# Patient Record
Sex: Female | Born: 1965 | Race: White | Hispanic: No | Marital: Married | Smoking: Never smoker
Health system: Southern US, Community
[De-identification: ages and names within clinical notes are randomized; demographics above are authoritative.]

## PROBLEM LIST (undated history)

## (undated) DIAGNOSIS — M8580 Other specified disorders of bone density and structure, unspecified site: Secondary | ICD-10-CM

## (undated) DIAGNOSIS — J302 Other seasonal allergic rhinitis: Secondary | ICD-10-CM

## (undated) HISTORY — DX: Other seasonal allergic rhinitis: J30.2

## (undated) HISTORY — DX: Other specified disorders of bone density and structure, unspecified site: M85.80

## (undated) HISTORY — PX: CHOLECYSTECTOMY: SHX55

---

## 1998-01-03 ENCOUNTER — Other Ambulatory Visit: Admission: RE | Admit: 1998-01-03 | Discharge: 1998-01-03 | Payer: Self-pay | Admitting: Obstetrics & Gynecology

## 1999-01-06 ENCOUNTER — Other Ambulatory Visit: Admission: RE | Admit: 1999-01-06 | Discharge: 1999-01-06 | Payer: Self-pay | Admitting: Obstetrics & Gynecology

## 1999-12-05 ENCOUNTER — Ambulatory Visit: Admission: RE | Admit: 1999-12-05 | Discharge: 1999-12-05 | Payer: Self-pay | Admitting: Sports Medicine

## 2000-02-01 ENCOUNTER — Other Ambulatory Visit: Admission: RE | Admit: 2000-02-01 | Discharge: 2000-02-01 | Payer: Self-pay | Admitting: Obstetrics & Gynecology

## 2000-06-05 ENCOUNTER — Inpatient Hospital Stay (HOSPITAL_COMMUNITY): Admission: RE | Admit: 2000-06-05 | Discharge: 2000-06-07 | Payer: Self-pay | Admitting: Family Medicine

## 2000-06-05 ENCOUNTER — Encounter: Payer: Self-pay | Admitting: Gastroenterology

## 2000-06-06 ENCOUNTER — Encounter: Payer: Self-pay | Admitting: General Surgery

## 2001-03-21 ENCOUNTER — Other Ambulatory Visit: Admission: RE | Admit: 2001-03-21 | Discharge: 2001-03-21 | Payer: Self-pay | Admitting: Obstetrics & Gynecology

## 2002-04-23 ENCOUNTER — Other Ambulatory Visit: Admission: RE | Admit: 2002-04-23 | Discharge: 2002-04-23 | Payer: Self-pay | Admitting: Obstetrics & Gynecology

## 2003-07-09 ENCOUNTER — Other Ambulatory Visit: Admission: RE | Admit: 2003-07-09 | Discharge: 2003-07-09 | Payer: Self-pay | Admitting: Obstetrics & Gynecology

## 2003-07-16 ENCOUNTER — Ambulatory Visit (HOSPITAL_BASED_OUTPATIENT_CLINIC_OR_DEPARTMENT_OTHER): Admission: RE | Admit: 2003-07-16 | Discharge: 2003-07-16 | Payer: Self-pay | Admitting: Plastic Surgery

## 2003-07-16 ENCOUNTER — Ambulatory Visit (HOSPITAL_COMMUNITY): Admission: RE | Admit: 2003-07-16 | Discharge: 2003-07-16 | Payer: Self-pay | Admitting: Plastic Surgery

## 2004-08-06 ENCOUNTER — Other Ambulatory Visit: Admission: RE | Admit: 2004-08-06 | Discharge: 2004-08-06 | Payer: Self-pay | Admitting: Obstetrics & Gynecology

## 2005-08-30 ENCOUNTER — Other Ambulatory Visit: Admission: RE | Admit: 2005-08-30 | Discharge: 2005-08-30 | Payer: Self-pay | Admitting: Obstetrics & Gynecology

## 2014-04-04 ENCOUNTER — Other Ambulatory Visit: Payer: Self-pay | Admitting: Obstetrics & Gynecology

## 2014-04-05 LAB — CYTOLOGY - PAP

## 2015-04-15 ENCOUNTER — Other Ambulatory Visit: Payer: Self-pay | Admitting: Obstetrics & Gynecology

## 2015-04-15 DIAGNOSIS — R928 Other abnormal and inconclusive findings on diagnostic imaging of breast: Secondary | ICD-10-CM

## 2015-04-23 ENCOUNTER — Ambulatory Visit
Admission: RE | Admit: 2015-04-23 | Discharge: 2015-04-23 | Disposition: A | Discharge status: Home / Self Care | Payer: Managed Care, Other (non HMO) | Source: Ambulatory Visit | Attending: Obstetrics & Gynecology | Admitting: Obstetrics & Gynecology

## 2015-04-23 DIAGNOSIS — R928 Other abnormal and inconclusive findings on diagnostic imaging of breast: Secondary | ICD-10-CM

## 2015-09-15 ENCOUNTER — Other Ambulatory Visit: Payer: Self-pay | Admitting: Obstetrics & Gynecology

## 2015-09-15 DIAGNOSIS — N63 Unspecified lump in unspecified breast: Secondary | ICD-10-CM

## 2015-09-25 ENCOUNTER — Encounter: Payer: Self-pay | Admitting: Family Medicine

## 2015-10-20 ENCOUNTER — Ambulatory Visit
Admission: RE | Admit: 2015-10-20 | Discharge: 2015-10-20 | Disposition: A | Discharge status: Home / Self Care | Payer: Managed Care, Other (non HMO) | Source: Ambulatory Visit | Attending: Obstetrics & Gynecology | Admitting: Obstetrics & Gynecology

## 2015-10-20 DIAGNOSIS — N63 Unspecified lump in unspecified breast: Secondary | ICD-10-CM

## 2016-04-06 ENCOUNTER — Other Ambulatory Visit: Payer: Self-pay | Admitting: Obstetrics & Gynecology

## 2016-04-06 DIAGNOSIS — N63 Unspecified lump in unspecified breast: Secondary | ICD-10-CM

## 2016-04-29 ENCOUNTER — Ambulatory Visit
Admission: RE | Admit: 2016-04-29 | Discharge: 2016-04-29 | Disposition: A | Discharge status: Home / Self Care | Payer: Managed Care, Other (non HMO) | Source: Ambulatory Visit | Attending: Obstetrics & Gynecology | Admitting: Obstetrics & Gynecology

## 2016-04-29 DIAGNOSIS — N63 Unspecified lump in unspecified breast: Secondary | ICD-10-CM

## 2016-07-21 ENCOUNTER — Encounter: Payer: Self-pay | Admitting: Gastroenterology

## 2016-09-17 ENCOUNTER — Ambulatory Visit (AMBULATORY_SURGERY_CENTER): Payer: Self-pay | Admitting: *Deleted

## 2016-09-17 ENCOUNTER — Encounter: Payer: Self-pay | Admitting: Gastroenterology

## 2016-09-17 VITALS — Ht 69.0 in | Wt 180.2 lb

## 2016-09-17 DIAGNOSIS — Z1211 Encounter for screening for malignant neoplasm of colon: Secondary | ICD-10-CM

## 2016-09-17 MED ORDER — NA SULFATE-K SULFATE-MG SULF 17.5-3.13-1.6 GM/177ML PO SOLN: 1.0000 | Freq: Once | ORAL | 0 refills | Status: AC

## 2016-09-17 NOTE — Progress Notes (Signed)
Denies allergies to eggs or soy products. Denies complications with sedation or anesthesia. Denies O2 use. Denies use of diet or weight loss medications.  Emmi instructions given for colonoscopy.  

## 2016-10-01 ENCOUNTER — Ambulatory Visit (AMBULATORY_SURGERY_CENTER): Payer: Managed Care, Other (non HMO) | Admitting: Gastroenterology

## 2016-10-01 ENCOUNTER — Encounter: Payer: Self-pay | Admitting: Gastroenterology

## 2016-10-01 VITALS — BP 127/75 | HR 63 | Temp 99.5°F | Resp 14 | Ht 69.0 in | Wt 180.0 lb

## 2016-10-01 DIAGNOSIS — Z1211 Encounter for screening for malignant neoplasm of colon: Secondary | ICD-10-CM | POA: Diagnosis not present

## 2016-10-01 DIAGNOSIS — Z1212 Encounter for screening for malignant neoplasm of rectum: Secondary | ICD-10-CM | POA: Diagnosis not present

## 2016-10-01 MED ORDER — SODIUM CHLORIDE 0.9 % IV SOLN: 500.0000 mL | INTRAVENOUS | Status: AC

## 2016-10-01 NOTE — Progress Notes (Signed)
  [facility] Anesthesia Post-op Note  Patient: Sheila Ruiz  Procedure(s) Performed: colonoscopy  Patient Location: [facility] - Recovery Area  Anesthesia Type: Deep Sedation/Propofol  Level of Consciousness: awake, oriented and patient cooperative  Airway and Oxygen Therapy: Patient Spontanous Breathing  Post-op Pain: none  Post-op Assessment:  Post-op Vital signs reviewed, Patient's Cardiovascular Status Stable, Respiratory Function Stable, Patent Airway, No signs of Nausea or vomiting and Pain level controlled  Post-op Vital Signs: Reviewed and stable  Complications: No apparent anesthesia complications  Sheila Ruiz 11:27 AM

## 2016-10-01 NOTE — Progress Notes (Signed)
Pt's states no medical or surgical changes since previsit or office visit. 

## 2016-10-01 NOTE — Patient Instructions (Signed)
YOU HAD AN ENDOSCOPIC PROCEDURE TODAY AT THE [facility]:   Refer to the procedure report that was given to you for any specific questions about what was found during the examination.  If the procedure report does not answer your questions, please call your gastroenterologist to clarify.  If you requested that your care partner not be given the details of your procedure findings, then the procedure report has been included in a sealed envelope for you to review at your convenience later.  YOU SHOULD EXPECT: Some feelings of bloating in the abdomen. Passage of more gas than usual.  Walking can help get rid of the air that was put into your GI tract during the procedure and reduce the bloating. If you had a lower endoscopy (such as a colonoscopy or flexible sigmoidoscopy) you may notice spotting of blood in your stool or on the toilet paper. If you underwent a bowel prep for your procedure, you may not have a normal bowel movement for a few days.  Please Note:  You might notice some irritation and congestion in your nose or some drainage.  This is from the oxygen used during your procedure.  There is no need for concern and it should clear up in a day or so.  SYMPTOMS TO REPORT IMMEDIATELY:   Following lower endoscopy (colonoscopy or flexible sigmoidoscopy):  Excessive amounts of blood in the stool  Significant tenderness or worsening of abdominal pains  Swelling of the abdomen that is new, acute  Fever of 100F or higher   For urgent or emergent issues, a gastroenterologist can be reached at any hour by calling [phone].   DIET:  We do recommend a small meal at first, but then you may proceed to your regular diet.  Drink plenty of fluids but you should avoid alcoholic beverages for 24 hours.  ACTIVITY:  You should plan to take it easy for the rest of today and you should NOT DRIVE or use heavy machinery until tomorrow (because of the sedation medicines used during the test).     FOLLOW UP: Our staff will call the number listed on your records the next business day following your procedure to check on you and address any questions or concerns that you may have regarding the information given to you following your procedure. If we do not reach you, we will leave a message.  However, if you are feeling well and you are not experiencing any problems, there is no need to return our call.  We will assume that you have returned to your regular daily activities without incident.  If any biopsies were taken you will be contacted by phone or by letter within the next 1-3 weeks.  Please call us at [phone] if you have not heard about the biopsies in 3 weeks.    SIGNATURES/CONFIDENTIALITY: You and/or your care partner have signed paperwork which will be entered into your electronic medical record.  These signatures attest to the fact that that the information above on your After Visit Summary has been reviewed and is understood.  Full responsibility of the confidentiality of this discharge information lies with you and/or your care-partner.  Thank you for letting us take care of your healthcare needs today. 

## 2016-10-01 NOTE — Op Note (Signed)
[facility] Patient Name: Sheila BergerMarianne Ruiz Procedure Date: 10/01/2016 11:03 AM MRN: [id number] Endoscopist: Sherilyn CooterHenry L. Myrtie Neitheranis , MD Age: [age] Referring MD:  Date of Birth: 1966-01-25 Gender: Female Account #: [id number] Procedure:                Colonoscopy Indications:              Screening for colorectal malignant neoplasm, This                            is the patient's first colonoscopy Medicines:                Monitored Anesthesia Care Procedure:                Pre-Anesthesia Assessment:                           - Prior to the procedure, a History and Physical                            was performed, and patient medications and                            allergies were reviewed. The patient's tolerance of                            previous anesthesia was also reviewed. The risks                            and benefits of the procedure and the sedation                            options and risks were discussed with the patient.                            All questions were answered, and informed consent                            was obtained. Prior Anticoagulants: The patient has                            taken no previous anticoagulant or antiplatelet                            agents. ASA Grade Assessment: I - A normal, healthy                            patient. After reviewing the risks and benefits,                            the patient was deemed in satisfactory condition to                            undergo the procedure.  After obtaining informed consent, the colonoscope                            was passed under direct vision. Throughout the                            procedure, the patient's blood pressure, pulse, and                            oxygen saturations were monitored continuously. The                            Model PCF-H190DL [id number]) scope was introduced                            through the anus and advanced  to the the cecum,                            identified by appendiceal orifice and ileocecal                            valve. The colonoscopy was performed without                            difficulty. The patient tolerated the procedure                            well. The quality of the bowel preparation was                            excellent. The ileocecal valve, appendiceal                            orifice, and rectum were photographed. The quality                            of the bowel preparation was evaluated using the                            BBPS [facility] Bowel Preparation Scale) with scores                            of: Right Colon = 3, Transverse Colon = 3 and Left                            Colon = 3 (entire mucosa seen well with no residual                            staining, small fragments of stool or opaque                            liquid). The total BBPS score equals 9. The bowel  preparation used was SUPREP. Scope In: 11:06:52 AM Scope Out: 11:20:15 AM Scope Withdrawal Time: 0 hours 9 minutes 15 seconds  Total Procedure Duration: 0 hours 13 minutes 23 seconds  Findings:                 The perianal and digital rectal examinations were                            normal.                           The entire examined colon appeared normal on direct                            and retroflexion views. Complications:            No immediate complications. Estimated Blood Loss:     Estimated blood loss: none. Impression:               - The entire examined colon is normal on direct and                            retroflexion views.                           - No specimens collected. Recommendation:           - Patient has a contact number available for                            emergencies. The signs and symptoms of potential                            delayed complications were discussed with the                            patient.  Return to normal activities tomorrow.                            Written discharge instructions were provided to the                            patient.                           - Resume previous diet.                           - Continue present medications.                           - Repeat colonoscopy in 10 years for screening                            purposes. Quantay Zaremba L. Myrtie Neither, MD 10/01/2016 11:22:43 AM This report has been signed electronically.

## 2016-10-04 ENCOUNTER — Telehealth: Payer: Self-pay

## 2016-10-04 ENCOUNTER — Telehealth: Payer: Self-pay | Admitting: *Deleted

## 2016-10-04 NOTE — Telephone Encounter (Signed)
Name identifier,left voicemail. 

## 2016-10-04 NOTE — Telephone Encounter (Signed)
  Follow up Call-  Call back number 10/01/2016  Post procedure Call Back phone  # [phone]  Permission to leave phone message Yes  Some recent data might be hidden     Patient questions:  Do you have a fever, pain , or abdominal swelling? No. Pain Score  0 *  Have you tolerated food without any problems? Yes.    Have you been able to return to your normal activities? Yes.    Do you have any questions about your discharge instructions: Diet   No. Medications  No. Follow up visit  No.  Do you have questions or concerns about your Care? No.  Actions: * If pain score is 4 or above: No action needed, pain <4.

## 2021-08-14 ENCOUNTER — Other Ambulatory Visit: Payer: Self-pay | Admitting: Family Medicine

## 2021-08-14 DIAGNOSIS — Z1231 Encounter for screening mammogram for malignant neoplasm of breast: Secondary | ICD-10-CM

## 2021-08-18 ENCOUNTER — Ambulatory Visit
Admission: RE | Admit: 2021-08-18 | Discharge: 2021-08-18 | Disposition: A | Discharge status: Home / Self Care | Payer: 59 | Source: Ambulatory Visit | Attending: Family Medicine | Admitting: Family Medicine

## 2021-08-18 DIAGNOSIS — Z1231 Encounter for screening mammogram for malignant neoplasm of breast: Secondary | ICD-10-CM

## 2022-04-04 IMAGING — MG MM DIGITAL SCREENING BILAT W/ TOMO AND CAD
8 series · 8 of 24 positions shown · non-contrast
Comparison: Previous exam(s).

CLINICAL DATA: Screening.

EXAM:
DIGITAL SCREENING BILATERAL MAMMOGRAM WITH TOMOSYNTHESIS AND CAD
TECHNIQUE: Bilateral screening digital craniocaudal and mediolateral oblique
mammograms were obtained. Bilateral screening digital breast
tomosynthesis was performed. The images were evaluated with
computer-aided detection.

[L CC synth-2D]
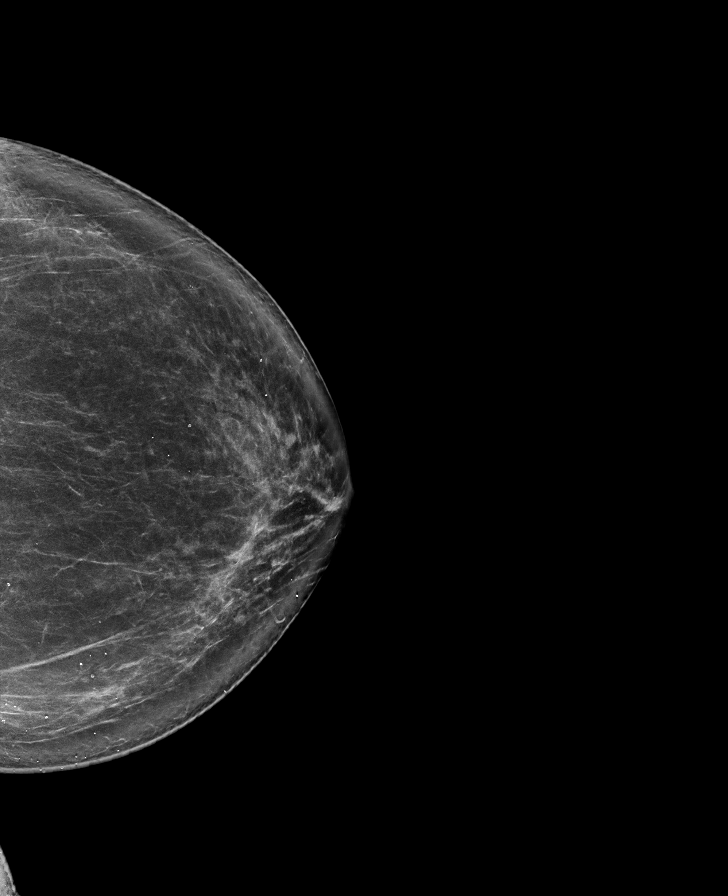

[R CC synth-2D]
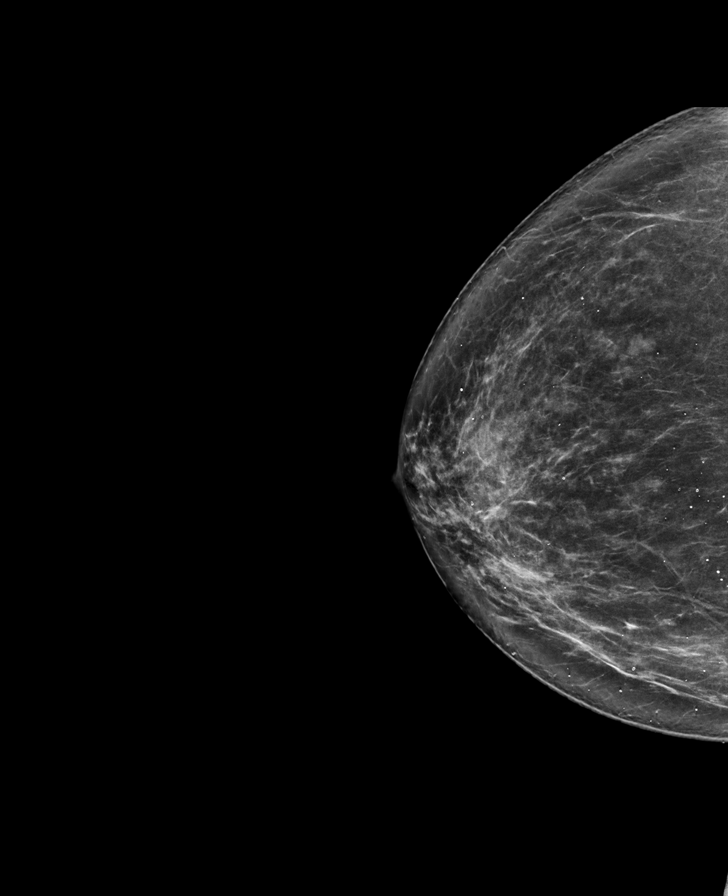

[R MLO synth-2D]
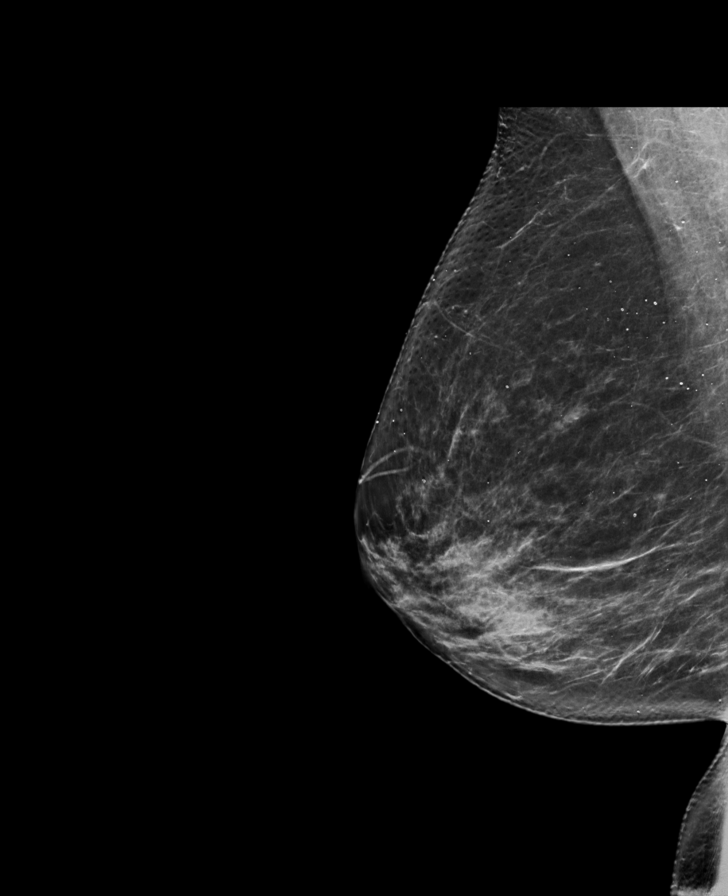

[L MLO synth-2D]
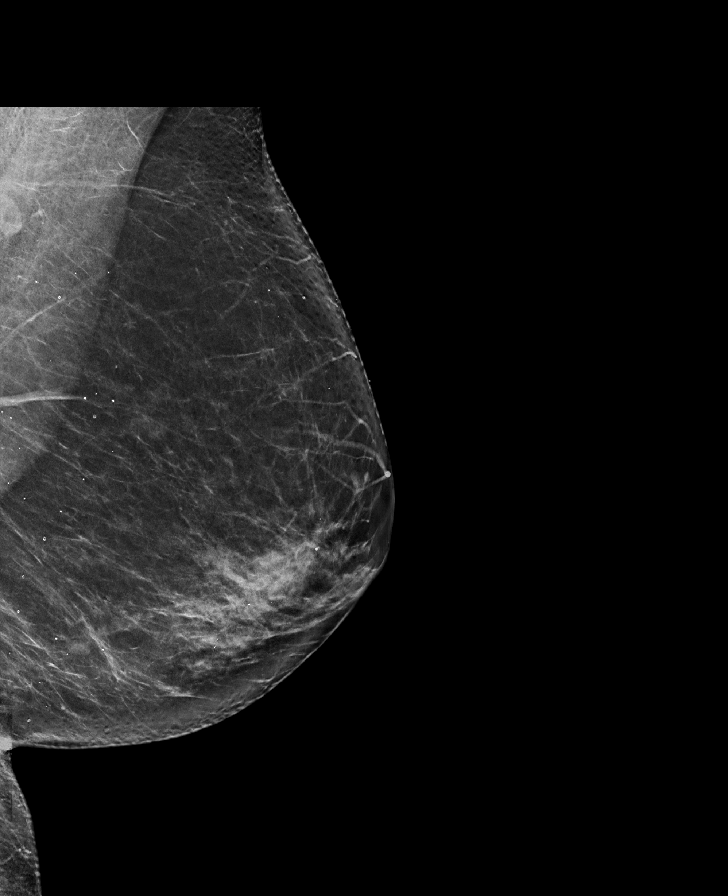

[R CC tomo · tomo slice 41/80.0]
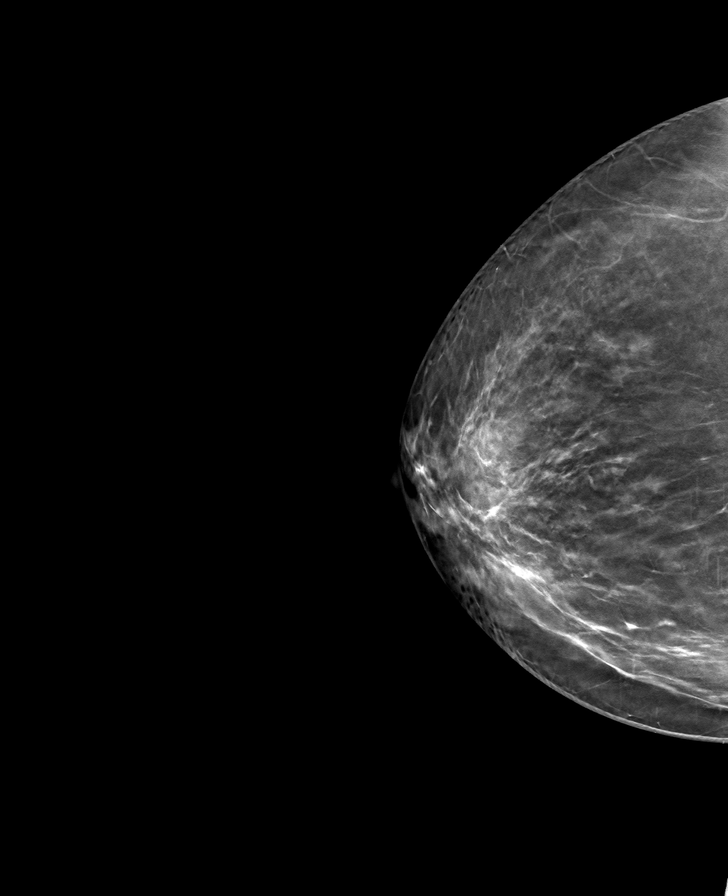

[L MLO tomo · tomo slice 43/86.0]
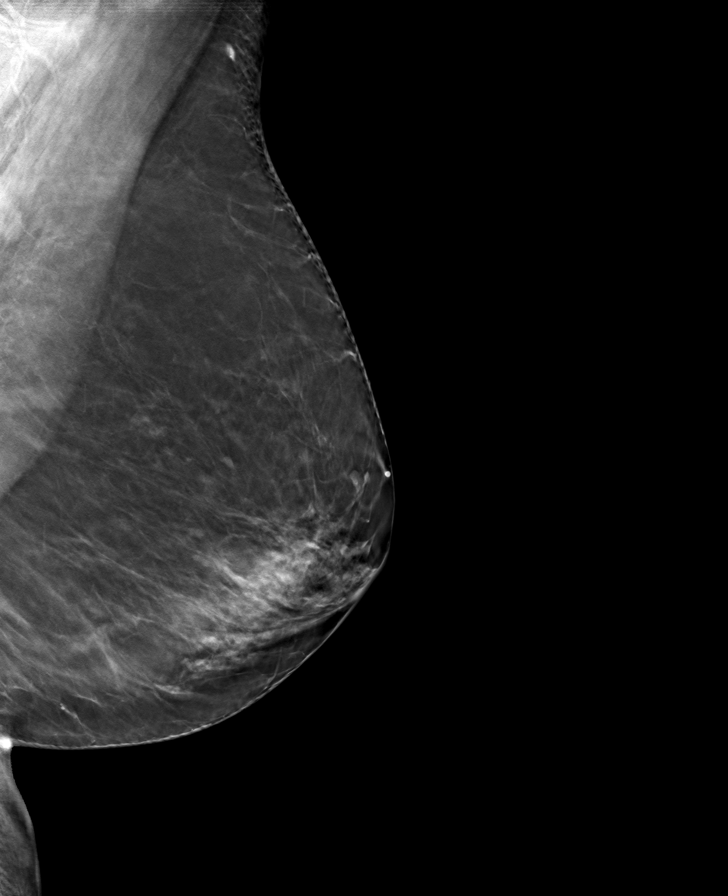

[R MLO tomo · tomo slice 42/83.0]
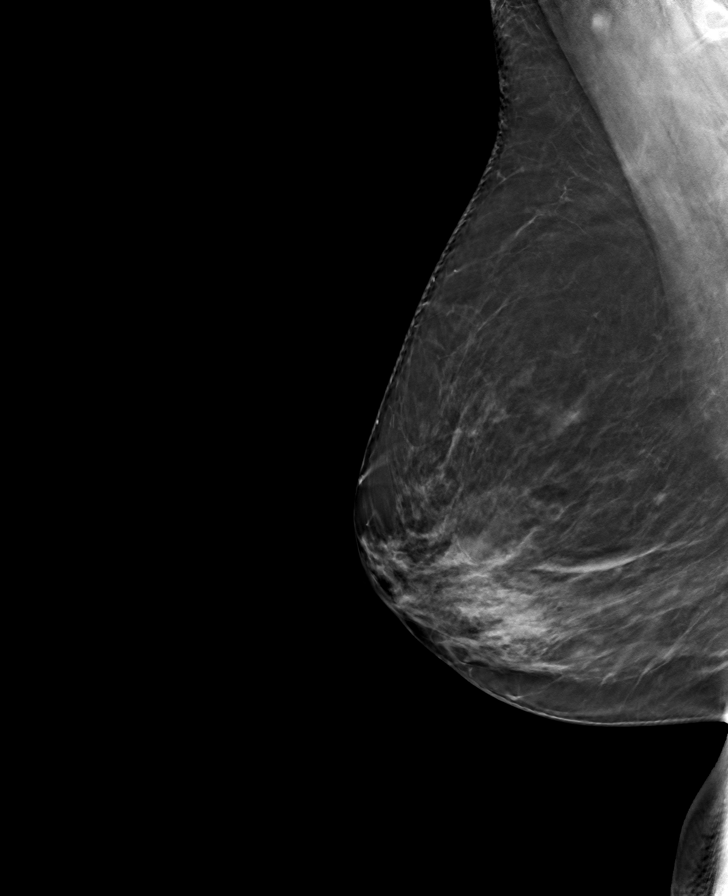

[L CC tomo · tomo slice 44/87.0]
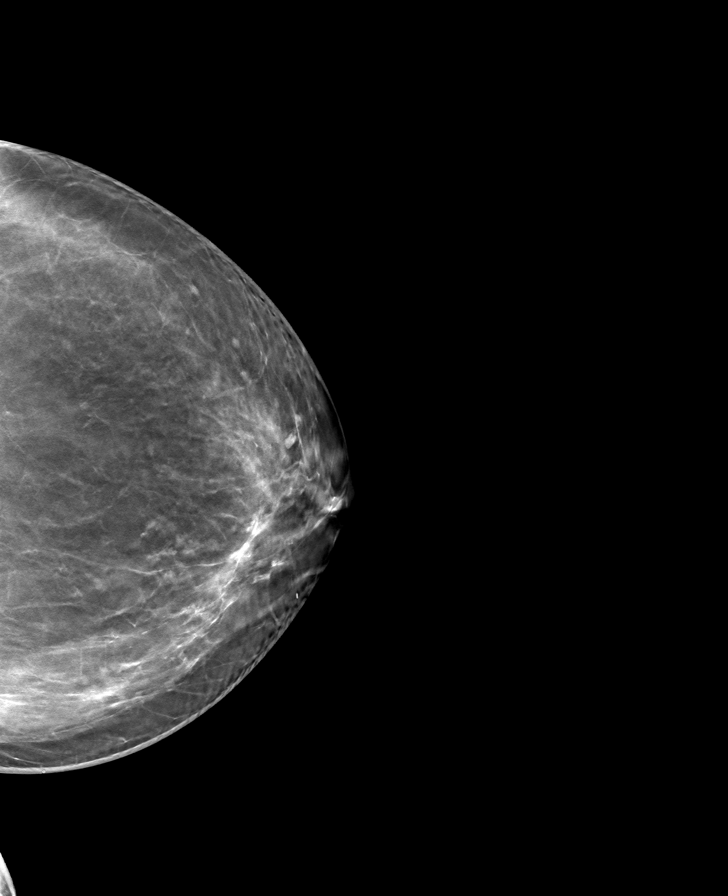

[8 of 24 positions shown; findings below may reference images not displayed]

ACR Breast Density Category c: The breast tissue is heterogeneously
dense, which may obscure small masses.
FINDINGS: There are no findings suspicious for malignancy.
IMPRESSION: No mammographic evidence of malignancy. A result letter of this
screening mammogram will be mailed directly to the patient.

RECOMMENDATION:
Screening mammogram in one year. (Code:Q3-W-BC3)

BI-RADS CATEGORY  1: Negative.

## 2023-12-20 ENCOUNTER — Other Ambulatory Visit: Payer: Self-pay | Admitting: Family Medicine

## 2023-12-20 DIAGNOSIS — Z1231 Encounter for screening mammogram for malignant neoplasm of breast: Secondary | ICD-10-CM

## 2023-12-30 ENCOUNTER — Ambulatory Visit
Admission: RE | Admit: 2023-12-30 | Discharge: 2023-12-30 | Disposition: A | Discharge status: Home / Self Care | Source: Ambulatory Visit | Attending: Family Medicine | Admitting: Family Medicine

## 2023-12-30 DIAGNOSIS — Z1231 Encounter for screening mammogram for malignant neoplasm of breast: Secondary | ICD-10-CM
